# Patient Record
Sex: Female | Born: 1975 | Race: White | Hispanic: No | Marital: Married | Smoking: Never smoker
Health system: Southern US, Community
[De-identification: ages and names within clinical notes are randomized; demographics above are authoritative.]

## PROBLEM LIST (undated history)

## (undated) DIAGNOSIS — N920 Excessive and frequent menstruation with regular cycle: Secondary | ICD-10-CM

## (undated) DIAGNOSIS — T4145XA Adverse effect of unspecified anesthetic, initial encounter: Secondary | ICD-10-CM

## (undated) DIAGNOSIS — K219 Gastro-esophageal reflux disease without esophagitis: Secondary | ICD-10-CM

## (undated) DIAGNOSIS — T8859XA Other complications of anesthesia, initial encounter: Secondary | ICD-10-CM

---
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
## 2002-01-22 HISTORY — PX: DILATION AND CURETTAGE OF UTERUS: SHX78

## 2004-05-10 ENCOUNTER — Encounter (INDEPENDENT_AMBULATORY_CARE_PROVIDER_SITE_OTHER): Payer: Self-pay | Admitting: *Deleted

## 2004-05-10 ENCOUNTER — Inpatient Hospital Stay (HOSPITAL_COMMUNITY): Admission: RE | Admit: 2004-05-10 | Discharge: 2004-05-13 | Payer: Self-pay | Admitting: Obstetrics and Gynecology

## 2004-05-14 ENCOUNTER — Encounter: Admission: RE | Admit: 2004-05-14 | Discharge: 2004-06-13 | Payer: Self-pay | Admitting: Obstetrics and Gynecology

## 2004-07-14 ENCOUNTER — Encounter: Admission: RE | Admit: 2004-07-14 | Discharge: 2004-08-13 | Payer: Self-pay | Admitting: Obstetrics and Gynecology

## 2004-09-13 ENCOUNTER — Encounter: Admission: RE | Admit: 2004-09-13 | Discharge: 2004-10-13 | Payer: Self-pay | Admitting: Obstetrics and Gynecology

## 2004-10-14 ENCOUNTER — Encounter: Admission: RE | Admit: 2004-10-14 | Discharge: 2004-10-27 | Payer: Self-pay | Admitting: Obstetrics and Gynecology

## 2007-07-04 ENCOUNTER — Inpatient Hospital Stay (HOSPITAL_COMMUNITY): Admission: RE | Admit: 2007-07-04 | Discharge: 2007-07-06 | Payer: Self-pay | Admitting: Obstetrics and Gynecology

## 2007-07-08 ENCOUNTER — Encounter: Admission: RE | Admit: 2007-07-08 | Discharge: 2007-08-06 | Payer: Self-pay | Admitting: Obstetrics and Gynecology

## 2007-08-07 ENCOUNTER — Encounter: Admission: RE | Admit: 2007-08-07 | Discharge: 2007-09-06 | Payer: Self-pay | Admitting: Obstetrics and Gynecology

## 2007-09-07 ENCOUNTER — Encounter: Admission: RE | Admit: 2007-09-07 | Discharge: 2007-10-07 | Payer: Self-pay | Admitting: Obstetrics and Gynecology

## 2007-10-08 ENCOUNTER — Encounter: Admission: RE | Admit: 2007-10-08 | Discharge: 2007-11-06 | Payer: Self-pay | Admitting: Obstetrics and Gynecology

## 2007-11-07 ENCOUNTER — Encounter: Admission: RE | Admit: 2007-11-07 | Discharge: 2007-12-07 | Payer: Self-pay | Admitting: Obstetrics and Gynecology

## 2007-12-08 ENCOUNTER — Encounter: Admission: RE | Admit: 2007-12-08 | Discharge: 2008-01-06 | Payer: Self-pay | Admitting: Obstetrics and Gynecology

## 2010-06-06 NOTE — Op Note (Signed)
NAMETIYE, HUWE NO.:  [id number]   MEDICAL RECORD NO.:  [id number]          PATIENT TYPE:  INP   LOCATION:  9139                          FACILITY:  [facility]   PHYSICIAN:  Maxie Better, M.D.DATE OF BIRTH:  12-16-1975   DATE OF PROCEDURE:  07/04/2007  DATE OF DISCHARGE:                               OPERATIVE REPORT   PREOPERATIVE DIAGNOSES:  1. Previous cesarean section.  2. Term gestation.   POSTOPERATIVE DIAGNOSES:  1. Previous cesarean section.  2. Term gestation.   PROCEDURE:  Repeat cesarean section Kerr hysterotomy.   ANESTHESIA:  Spinal.   SURGEON:  Maxie Better, MD   ASSISTANT:  Marlinda Mike, CNM   PROCEDURE:  Under adequate spinal anesthesia, the patient was placed in  a supine position with a left lateral tilt.  She was sterilely prepped  and draped in the usual fashion and an indwelling Foley catheter was  sterilely placed.  Marcaine 0.25% was injected along the previous  Pfannenstiel skin incision site.  Skin incision was then made and  carried on to the rectus fascia.  Rectus fascia was opened transversely.  Rectus fascia was then bluntly and sharply dissected off the rectus  muscles in a superior and inferior fashion.  The rectus muscles already  slightly separated.  The parietal peritoneum was opened sharply and  extended.  There was bladder adhesions noted on the lower uterine  segment.  Careful dissection resulted in just a small amount of the  bladder flap to be displaced inferiorly.  Nonetheless, a curvilinear low  transverse uterine incision was able to be made and extended with  bandage scissors.  Artificial rupture of membranes clear amniotic fluid  noted.  Subsequent delivery of a live female was accomplished.  Baby was  bulb suctioned and the abdomen and cord was clamped and cut.  The baby  was transferred to the awaiting pediatrician with assigned Apgars of 9  and 9 at 1 and 5 minutes.  The placenta was manually  removed.  Uterine  cavity was cleaned of debris.  Uterus was exteriorized.  Uterine  incision was noted to have no extension.  Uterine incision was then  closed in two layers, the first layer with 0 Monocryl running lock  stitch.  Due to the thinness of the lower uterine segment, a second  imbricating stitch was not placed.  A reinforcing suture was placed in  the right angle due to a small amount of bleeding.  Normal tubes and  ovaries were noted bilaterally.  The uterus was then returned to the  abdomen.  The abdomen was copiously irrigated and suctioned of debris.  The parietal peritoneum was closed with 2-0 Vicryl.  The rectus fascia  was closed with 0 Vicryl x2.  There was a pumping blood vessel  inferiorly which had been clamped and suture ligated x2 with good  hemostasis noted.  The rectus fascia was closed with 0 Vicryl x2.  The  subcutaneous area was irrigated and small bleeders cauterized.  Interrupted 2-0 plain sutures were placed followed by Ethicon staples  for the skin.   SPECIMENS:  Placenta  not sent to pathology.   ESTIMATED BLOOD LOSS:  800 mL.   INTRAOPERATIVE FLUID:  1600 mL crystalloid.   URINE OUTPUT:  350 mL clear yellow urine.   COUNTS:  Sponge and instrument counts x2 was correct.   COMPLICATION:  None.   The weight of the baby was 7 pounds 7 ounces.   The patient tolerated the procedure well and was transferred to the  recovery room in stable condition.      Maxie Better, M.D.  Electronically Signed     [place]/MEDQ  D:  07/04/2007  T:  07/04/2007  Job:  [id number]

## 2010-06-06 NOTE — Discharge Summary (Signed)
Carly Gonzales, STENGEL NO.:  [id number]   MEDICAL RECORD NO.:  [id number]          PATIENT TYPE:  INP   LOCATION:  9139                          FACILITY:  [facility]   PHYSICIAN:  Maxie Better, M.D.DATE OF BIRTH:  1975/02/27   DATE OF ADMISSION:  07/04/2007  DATE OF DISCHARGE:  07/06/2007                               DISCHARGE SUMMARY   ADMISSION DIAGNOSIS:  Previous cesarean section, term gestation.   DISCHARGE DIAGNOSIS:  Previous cesarean section, term gestation,  delivered.   PROCEDURE:  Repeat cesarean section.   HISTORY OF PRESENT ILLNESS:  A 35 year old gravida 2, para 1-0-0-1  female with a previous cesarean section, requesting a repeat cesarean  section.   HOSPITAL COURSE:  The patient was admitted.  She underwent a repeat  cesarean section.  The patient had a live female, 7 pounds 7 ounces,  Apgars of 9 and 9.  She had an uncomplicated postoperative course.  Her  CBC showed a hemoglobin of 8.4, hematocrit 25.8, white count of 11.5,  platelet count of 149,000.  Her admission hemoglobin was 10.4.  The  patient requested early discharge.   Her disposition is home.  Condition stable.   DISCHARGE MEDICATIONS:  1. Motrin 600 mg every 6 hours p.r.n. pain.  2. Percocet 1-2 tablets every 3-4 hours p.r.n. pain.  3. Nu-Iron 150 mg 1 p.o. b.i.d.  4. Colace 100 mg p.o. b.i.d.   DISCHARGE INSTRUCTIONS:  Per the postpartum booklet given.  Followup  appointment at [facility] in 6 weeks.      Maxie Better, M.D.  Electronically Signed     [place]/MEDQ  D:  08/14/2007  T:  08/14/2007  Job:  [id number]

## 2010-06-09 NOTE — H&P (Signed)
Carly Gonzales, Carly Gonzales NO.:  [id number]   MEDICAL RECORD NO.:  [id number]          PATIENT TYPE:  INP   LOCATION:  NA                            FACILITY:  [facility]   PHYSICIAN:  Maxie Better, M.D.DATE OF BIRTH:  [birth year]   DATE OF ADMISSION:  05/10/2004  DATE OF DISCHARGE:                                HISTORY & PHYSICAL   CHIEF COMPLAINT:  Fetal macrosomia.   HISTORY OF PRESENT ILLNESS:  This is a 35 year old, gravida 2, para 0-0-1-0,  married white female, EDC of May 12, 2004 by first trimester ultrasound  whose now at 51 5/[redacted] weeks gestation being admitted for primary cesarean  section after ultrasound for size greater than dates revealed an estimated  fetal weight of 4124 g which is at the 92nd percentile. The abdominal  circumference was at the 99th percentile. Normal amniotic fluid index noted.  The patient's prenatal course has been notable for an abnormal one hour  glucose challenge test, a three hour glucose tolerance test that had one  abnormal value by ACOG and two abnormal values by the Carpenter-Coustan  criteria. The patient was not monitored  with her blood sugars; however, in  light of the larger abdominal circumference, the estimated fetal weight of  the baby in a patient who is 5 feet 1, 169.5 pounds with a cervix that is  long, closed, presenting parts out of pelvis. The patient was offered a  primary cesarean section. She was also given the option of awaiting labor  with the possibility of a primary cesarean section as a result of failure to  dilate and/or full dilatation with concerns for shoulder dystocia. After  revealing the risks of cesarean section, the patient is now presenting for  her procedure. She has had good fetal movement, no contractions. Prenatal  care is [facility], primary obstetrician Maxie Better, M.D.   PRENATAL LABS:  Blood type A positive, antibody screen is negative, RPR is  nonreactive, rubella is  immune, hepatitis B surface antigen is negative, HIV  test was declined. Genetic screening was declined. A one hour glucose  challenge test was 161, three hour glucose tolerance test was 69, at fasting  one hour 191, two hour 156, and three hour at 110. Group B strep culture was  negative. Previous ultrasound on April 13, 2004 had an appropriate growth  baby at 5 pounds 15 ounces in the 41st percentile.   PAST MEDICAL HISTORY:  Allergy is to PENICILLIN. Medical history negative.   MEDICATIONS:  Prenatal vitamins, Zantac, Tums.   PAST SURGICAL HISTORY:  D&C.   OB HISTORY:  MAB November 2004.   FAMILY HISTORY:  Maternal first cousin with osteogenic imperfecta, maternal  grandfather with Huntington's chorea.   SOCIAL HISTORY:  Married, nonsmoker, works in Air[employer].   REVIEW OF SYMPTOMS:  Negative.   PHYSICAL EXAMINATION:  GENERAL:  Well-developed, well-nourished, gravid  white female in no acute distress.  VITAL SIGNS:  Blood pressure 120/80, weight 169.25, fetal heart rate of 130.  SKIN:  No lesions.  HEENT:  Anicteric sclera, pink conjunctiva, oropharynx negative.  HEART:  Regular  rate and rhythm without murmur.  LUNGS:  Clear to auscultation.  BREASTS:  Soft, nontender, no palpable mass.  ABDOMEN:  Gravid, fundal height of 41.5 cm.  PELVIC:  Cervix is long, closed, presenting part not in pelvis. Vertex by  ultrasound.  EXTREMITIES:  No edema.   IMPRESSION:  Fetal macrosomia, possible gestational diabetes, term  gestation.   PLAN:  Admission for primary cesarean section, routine admission labs and  orders. The risks of cesarean section were discussed including but not  limited to infection, bleeding, injury to surrounding organ structures,  possible need cesarean section in the future. A lengthy discussion was also  done with respect to the margin of error of the ultrasound which is  plus/minus a pound. All questions answered.      /MEDQ  D:  05/10/2004  T:  05/10/2004   Job:  [id number]

## 2010-10-19 LAB — CBC
Hemoglobin: 8.4 — ABNORMAL LOW
MCV: 75.5 — ABNORMAL LOW
Platelets: 173
RBC: 3.41 — ABNORMAL LOW
RDW: 19.7 — ABNORMAL HIGH
RDW: 19.7 — ABNORMAL HIGH
WBC: 7.7

## 2010-10-19 LAB — CCBB MATERNAL DONOR DRAW

## 2010-10-19 LAB — RPR: RPR Ser Ql: NONREACTIVE

## 2013-01-28 ENCOUNTER — Ambulatory Visit
Admission: RE | Admit: 2013-01-28 | Discharge: 2013-01-28 | Disposition: A | Discharge status: Home / Self Care | Payer: 59 | Source: Ambulatory Visit | Attending: Family Medicine | Admitting: Family Medicine

## 2013-01-28 ENCOUNTER — Other Ambulatory Visit: Payer: Self-pay | Admitting: Family Medicine

## 2013-01-28 ENCOUNTER — Other Ambulatory Visit (HOSPITAL_COMMUNITY)
Admission: RE | Admit: 2013-01-28 | Discharge: 2013-01-28 | Disposition: A | Discharge status: Home / Self Care | Payer: 59 | Source: Ambulatory Visit | Attending: Family Medicine | Admitting: Family Medicine

## 2013-01-28 DIAGNOSIS — M545 Low back pain, unspecified: Secondary | ICD-10-CM

## 2013-01-28 DIAGNOSIS — Z0000 Encounter for general adult medical examination without abnormal findings: Secondary | ICD-10-CM | POA: Insufficient documentation

## 2014-11-25 ENCOUNTER — Other Ambulatory Visit: Payer: Self-pay | Admitting: Advanced Practice Midwife

## 2015-03-16 ENCOUNTER — Other Ambulatory Visit: Payer: Self-pay | Admitting: Family Medicine

## 2015-03-16 ENCOUNTER — Other Ambulatory Visit (HOSPITAL_COMMUNITY)
Admission: RE | Admit: 2015-03-16 | Discharge: 2015-03-16 | Disposition: A | Discharge status: Home / Self Care | Payer: BLUE CROSS/BLUE SHIELD | Source: Ambulatory Visit | Attending: Family Medicine | Admitting: Family Medicine

## 2015-03-16 DIAGNOSIS — Z01419 Encounter for gynecological examination (general) (routine) without abnormal findings: Secondary | ICD-10-CM | POA: Insufficient documentation

## 2015-03-21 LAB — CYTOLOGY - PAP

## 2015-09-01 IMAGING — CR DG LUMBAR SPINE COMPLETE 4+V
5 series · 5 of 5 positions shown · non-contrast
Comparison: None.

CLINICAL DATA: Right-sided low back pain and right leg pain.

EXAM:
LUMBAR SPINE - COMPLETE 4+ VIEW

[view not recorded (1 of 5)]
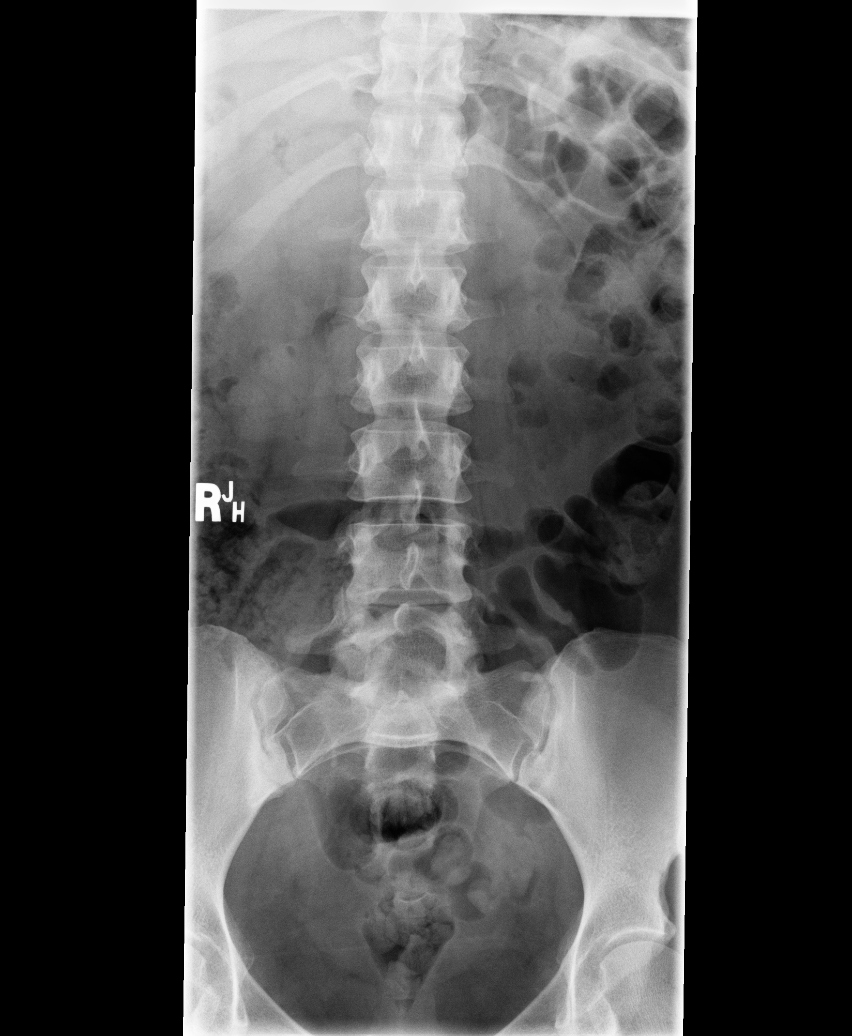

[view not recorded (2 of 5)]
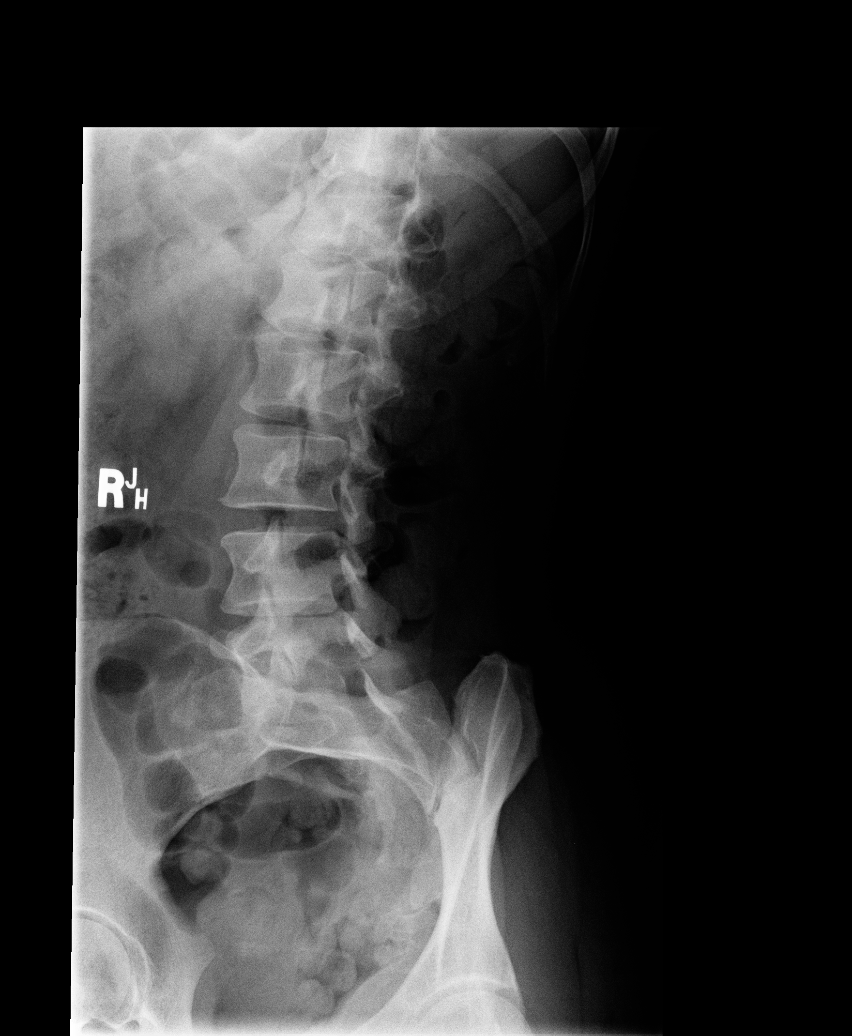

[view not recorded (3 of 5)]
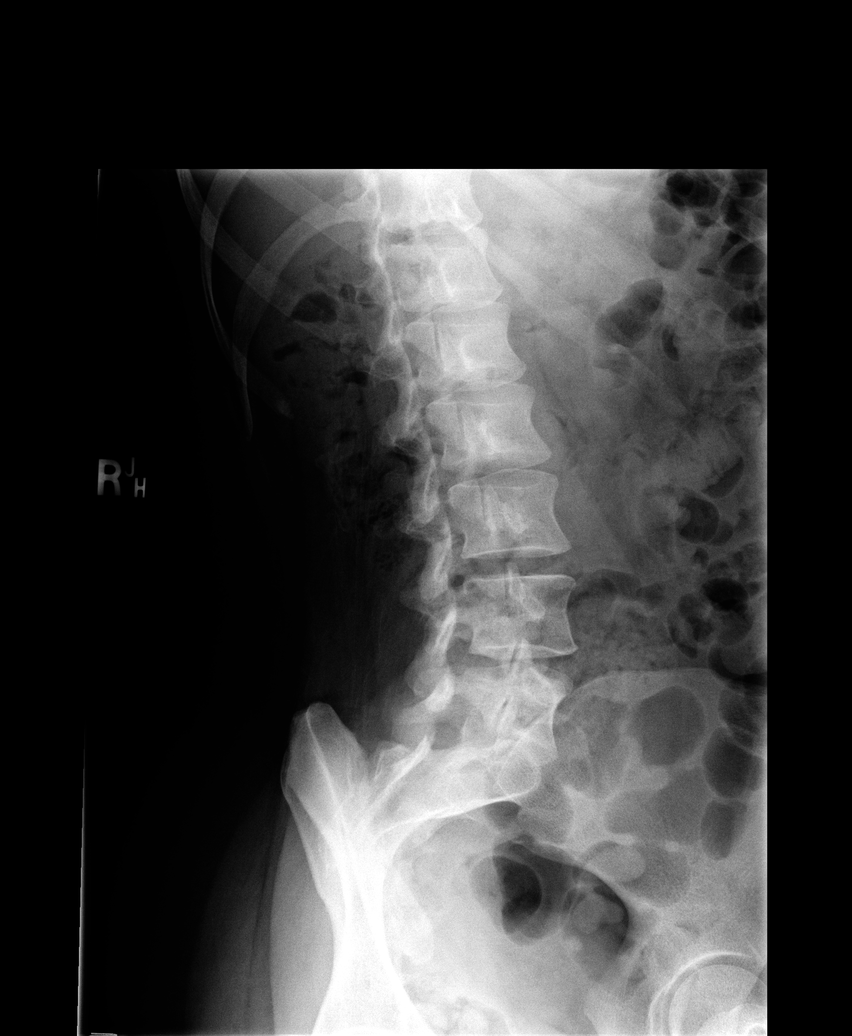

[view not recorded (4 of 5)]
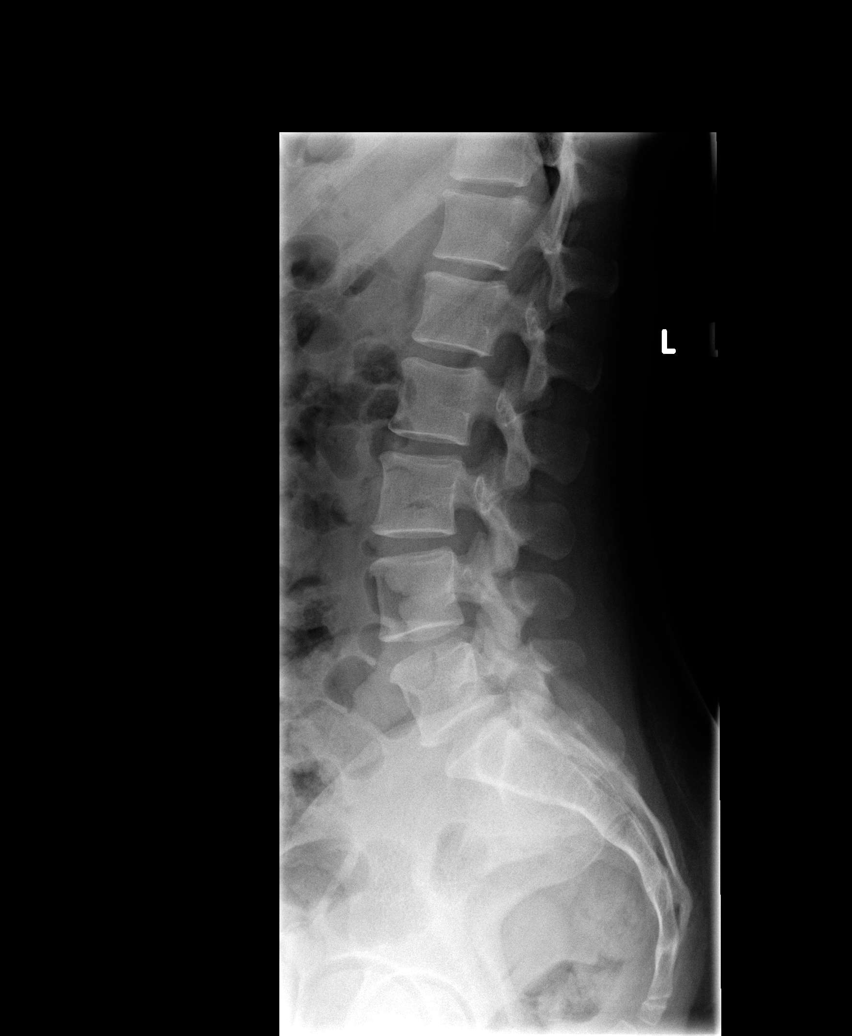

[view not recorded (5 of 5)]
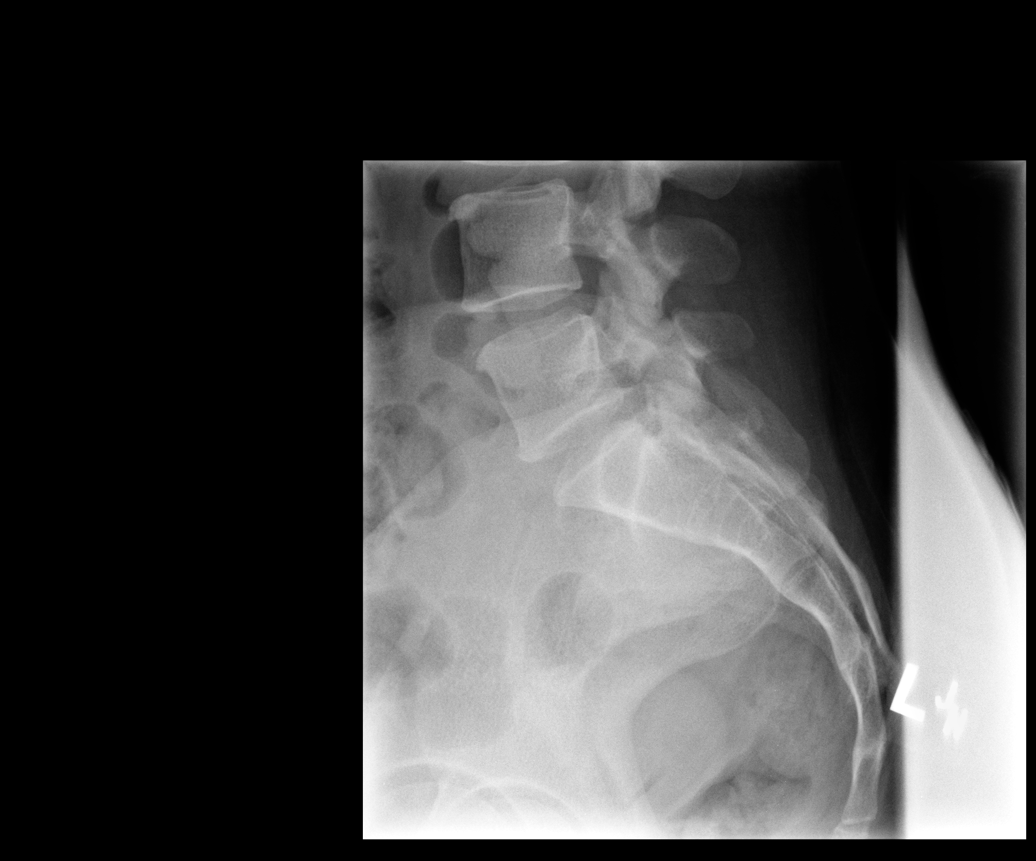

[5 of 5 positions shown; findings below may reference images not displayed]

FINDINGS: Alignment is anatomic. Vertebral body and disc space height are
maintained. Trace endplate degenerative changes from L3-4 to L5-S1.
No definite pars defects.
IMPRESSION: Minimal spondylosis.

## 2017-12-31 ENCOUNTER — Encounter: Payer: Self-pay | Admitting: Family Medicine

## 2018-01-27 ENCOUNTER — Encounter: Payer: 59 | Attending: Family Medicine | Admitting: Registered"

## 2018-01-27 ENCOUNTER — Encounter: Payer: Self-pay | Admitting: Registered"

## 2018-01-27 DIAGNOSIS — E785 Hyperlipidemia, unspecified: Secondary | ICD-10-CM | POA: Diagnosis present

## 2018-01-27 NOTE — Patient Instructions (Addendum)
Exercise 3x week, keep clothes in car for impromptu exercise opportunities. Mindful meditiation: https://palousemindfulness.com/ or Rodell Perna, 21 day meditation Aim to get more fruits and vegetables, increase by 1-2 servings per day Pay attention food with creamy sauce, cheese, red meat (choosing poultry more often)

## 2018-01-27 NOTE — Progress Notes (Signed)
Medical Nutrition Therapy:  Appt start time: 1615 end time:  1715.   Assessment:  Primary concerns today: Patient states she wants to accomplish some long-term goals, lowering her LDL cholesterol before her next annual wellness visit, and some weight loss. Pt states she has tried weight watchers before and liked the flexibility of the plan, is currently trying intermittent fasting with an eating window of about 12-8 pm. Pt states she used to have coffee with cream in the morning and a small breakfast. Pt states before making changes she would have an afternoon lull in energy, but is not experiencing that now. Pt states she is feeling less sluggish.  Patient states she thinks she may be eating too large of portion sizes, sometimes eats as much as her husband. Pt states she consciously puts on her plate the amount she feels will satisfy her instead of the amount she could eat which would be more. Pt states she stops eating when her plate is empty, except sometimes when eating out will take left overs home. Pt states if she wants to eat more she will drink water.  Eating style:  Lunch: eats in conference room 30 min, usually has company. Sometimes orders out with coworkers - yesterday [employer]. Dinner: Eats at the table with family, aims to eat balanced meals.  Pt states over the holidays they had more sweets in their home while her mother was staying with them. Pt states she joined [employer] 2 months ago and was going  3x/week with 63 yr old son, T,Th,Sat. Circuit training 30 min + cardio until the holidays hit and has not gone. 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 Recently 63 mo old puppy disturbs sleep. Stress: 6/10, tries not to get too stress. Pt states her body might be experiencing more stress that her mind thinks it  is.  Preferred Learning Style:   No preference indicated   Learning Readiness:   Change in progress   MEDICATIONS: none   DIETARY INTAKE:  Usual eating pattern includes 2 meals and 0-1 snacks per day. Intermittent Fasting for the last 4 days; eating window 1-9 pm  24-hr recall:  B ( AM):water, black coffee Snk ( AM): none  L (1 PM): left overs OR sandwich or salad Snk ( PM): none D (6 PM): spaghetti OR tacos OR chicken OR sushi OR burger Snk ( PM): none OR sm piece of cake OR fruit Beverages: water, coffee, diet coke 2/month, wine with dinner sometimes  Usual physical activity: ADL, sedentary work  Estimated energy needs: 1800 calories  Progress Towards Goal(s):  New goals.   Nutritional Diagnosis:  NI-5.8.5 Inadeqate fiber intake As related to less than recommended fruit and vegetable intake.  As evidenced by dietary recall.    Intervention:  Nutrition Education. Discussed MyPlate. Discussed importance of hunger and fullness cues for deciding amount to eat and using vegetables instead of water to help with fullness cues. Discussed importance of mindfulness for stress management.  Plan: Exercise 3x week, keep clothes in car for impromptu exercise opportunities. Mindful meditiation: https://palousemindfulness.com/ or [employer], 21 day meditation Aim to get more fruits and vegetables, increase by 1-2 servings per day Pay attention food with creamy sauce, cheese, red meat (choosing poultry more often)   Teaching Method Utilized:  Visual Auditory Hands  on  Handouts given during visit include:  none  Barriers to learning/adherence to lifestyle change: none  Demonstrated degree of understanding via:  Teach Back   Monitoring/Evaluation:  Dietary intake, exercise, and body weight in 4 week(s).

## 2018-02-26 DIAGNOSIS — Z298 Encounter for other specified prophylactic measures: Secondary | ICD-10-CM | POA: Diagnosis not present

## 2018-03-03 ENCOUNTER — Ambulatory Visit: Payer: 59 | Admitting: Registered"

## 2018-05-07 DIAGNOSIS — M7711 Lateral epicondylitis, right elbow: Secondary | ICD-10-CM | POA: Diagnosis not present

## 2018-05-22 DIAGNOSIS — N390 Urinary tract infection, site not specified: Secondary | ICD-10-CM | POA: Diagnosis not present

## 2019-02-10 ENCOUNTER — Other Ambulatory Visit: Payer: Self-pay

## 2019-02-10 ENCOUNTER — Other Ambulatory Visit (HOSPITAL_COMMUNITY)
Admission: RE | Admit: 2019-02-10 | Discharge: 2019-02-10 | Disposition: A | Discharge status: Home / Self Care | Payer: 59 | Source: Ambulatory Visit | Attending: Obstetrics and Gynecology | Admitting: Obstetrics and Gynecology

## 2019-02-10 ENCOUNTER — Other Ambulatory Visit: Payer: Self-pay | Admitting: Obstetrics and Gynecology

## 2019-02-10 ENCOUNTER — Encounter (HOSPITAL_BASED_OUTPATIENT_CLINIC_OR_DEPARTMENT_OTHER): Payer: Self-pay | Admitting: Obstetrics and Gynecology

## 2019-02-10 DIAGNOSIS — Z01812 Encounter for preprocedural laboratory examination: Secondary | ICD-10-CM | POA: Diagnosis present

## 2019-02-10 DIAGNOSIS — Z20822 Contact with and (suspected) exposure to covid-19: Secondary | ICD-10-CM | POA: Diagnosis not present

## 2019-02-10 LAB — SARS CORONAVIRUS 2 (TAT 6-24 HRS): SARS Coronavirus 2: NEGATIVE

## 2019-02-10 NOTE — Progress Notes (Signed)
Spoke w/ via phone for pre-op interview--- PT Lab needs dos---- CBC, Urine preg             Lab results------ no COVID test ------ 02-10-2019 @ 1230 Arrive at ------- 0915 NPO after ------ MN Medications to take morning of surgery ----- Zegerid w/ sips of water Diabetic medication ----- n/a Patient Special Instructions ----- n/a Pre-Op special Istructions ----- pt case added on today,  Pre-op orders pending for second sign Patient verbalized understanding of instructions that were given at this phone interview. Patient denies shortness of breath, chest pain, fever, cough a this phone interview.

## 2019-02-12 ENCOUNTER — Ambulatory Visit (HOSPITAL_BASED_OUTPATIENT_CLINIC_OR_DEPARTMENT_OTHER): Payer: 59 | Admitting: Anesthesiology

## 2019-02-12 ENCOUNTER — Encounter (HOSPITAL_BASED_OUTPATIENT_CLINIC_OR_DEPARTMENT_OTHER): Payer: Self-pay | Admitting: Obstetrics and Gynecology

## 2019-02-12 ENCOUNTER — Other Ambulatory Visit: Payer: Self-pay

## 2019-02-12 ENCOUNTER — Encounter (HOSPITAL_BASED_OUTPATIENT_CLINIC_OR_DEPARTMENT_OTHER)
Admission: RE | Disposition: A | Discharge status: Home / Self Care | Payer: Self-pay | Source: Ambulatory Visit | Attending: Obstetrics and Gynecology

## 2019-02-12 ENCOUNTER — Ambulatory Visit (HOSPITAL_BASED_OUTPATIENT_CLINIC_OR_DEPARTMENT_OTHER)
Admission: RE | Admit: 2019-02-12 | Discharge: 2019-02-12 | Disposition: A | Discharge status: Home / Self Care | Payer: 59 | Source: Ambulatory Visit | Attending: Obstetrics and Gynecology | Admitting: Obstetrics and Gynecology

## 2019-02-12 DIAGNOSIS — Z539 Procedure and treatment not carried out, unspecified reason: Secondary | ICD-10-CM | POA: Insufficient documentation

## 2019-02-12 DIAGNOSIS — N920 Excessive and frequent menstruation with regular cycle: Secondary | ICD-10-CM | POA: Diagnosis present

## 2019-02-12 DIAGNOSIS — K219 Gastro-esophageal reflux disease without esophagitis: Secondary | ICD-10-CM | POA: Insufficient documentation

## 2019-02-12 DIAGNOSIS — Z79899 Other long term (current) drug therapy: Secondary | ICD-10-CM | POA: Diagnosis not present

## 2019-02-12 HISTORY — DX: Excessive and frequent menstruation with regular cycle: N92.0

## 2019-02-12 HISTORY — DX: Other complications of anesthesia, initial encounter: T88.59XA

## 2019-02-12 HISTORY — DX: Gastro-esophageal reflux disease without esophagitis: K21.9

## 2019-02-12 LAB — POCT PREGNANCY, URINE: Preg Test, Ur: NEGATIVE

## 2019-02-12 LAB — CBC
HCT: 42.7 % (ref 36.0–46.0)
Hemoglobin: 13.8 g/dL (ref 12.0–15.0)
MCH: 30 pg (ref 26.0–34.0)
MCHC: 32.3 g/dL (ref 30.0–36.0)
MCV: 92.8 fL (ref 80.0–100.0)
Platelets: 260 10*3/uL (ref 150–400)
RBC: 4.6 MIL/uL (ref 3.87–5.11)
RDW: 13.9 % (ref 11.5–15.5)
WBC: 7 10*3/uL (ref 4.0–10.5)
nRBC: 0 % (ref 0.0–0.2)

## 2019-02-12 SURGERY — DILATATION & CURETTAGE/HYSTEROSCOPY WITH NOVASURE ABLATION
Anesthesia: General

## 2019-02-12 MED ORDER — SODIUM CHLORIDE 0.9 % IR SOLN
Status: DC | PRN
  Administered 2019-02-12: 3000 mL

## 2019-02-12 MED ORDER — MIDAZOLAM HCL 2 MG/2ML IJ SOLN
INTRAMUSCULAR | Status: AC
  Filled 2019-02-12: qty 2

## 2019-02-12 MED ORDER — PHENYLEPHRINE 40 MCG/ML (10ML) SYRINGE FOR IV PUSH (FOR BLOOD PRESSURE SUPPORT)
PREFILLED_SYRINGE | INTRAVENOUS | Status: AC
  Filled 2019-02-12: qty 10

## 2019-02-12 MED ORDER — PHENYLEPHRINE HCL (PRESSORS) 10 MG/ML IV SOLN
INTRAVENOUS | Status: AC
  Filled 2019-02-12: qty 4

## 2019-02-12 MED ORDER — WHITE PETROLATUM EX OINT
TOPICAL_OINTMENT | CUTANEOUS | Status: AC
  Filled 2019-02-12: qty 5

## 2019-02-12 MED ORDER — LACTATED RINGERS IV SOLN
INTRAVENOUS | Status: DC
  Filled 2019-02-12: qty 1000

## 2019-02-12 MED ORDER — FENTANYL CITRATE (PF) 100 MCG/2ML IJ SOLN
INTRAMUSCULAR | Status: AC
  Filled 2019-02-12: qty 2

## 2019-02-12 MED ORDER — EPHEDRINE 5 MG/ML INJ
INTRAVENOUS | Status: AC
  Filled 2019-02-12: qty 10

## 2019-02-12 MED ORDER — GLYCOPYRROLATE PF 0.2 MG/ML IJ SOSY
PREFILLED_SYRINGE | INTRAMUSCULAR | Status: AC
  Filled 2019-02-12: qty 1

## 2019-02-12 MED ORDER — PROPOFOL 10 MG/ML IV BOLUS
INTRAVENOUS | Status: AC
  Filled 2019-02-12: qty 20

## 2019-02-12 SURGICAL SUPPLY — 21 items
ABLATOR ENDOMETRIAL MYOSURE (ABLATOR) IMPLANT
BIPOLAR CUTTING LOOP 21FR (ELECTRODE)
CANISTER SUCT 3000ML PPV (MISCELLANEOUS) ×2 IMPLANT
CATH ROBINSON RED A/P 16FR (CATHETERS) IMPLANT
COVER WAND RF STERILE (DRAPES) ×2 IMPLANT
DILATOR CANAL MILEX (MISCELLANEOUS) IMPLANT
GAUZE 4X4 16PLY RFD (DISPOSABLE) ×2 IMPLANT
GAUZE VASELINE 1X8 (GAUZE/BANDAGES/DRESSINGS) IMPLANT
GLOVE BIOGEL PI IND STRL 7.0 (GLOVE) ×1 IMPLANT
GLOVE BIOGEL PI INDICATOR 7.0 (GLOVE) ×1
GLOVE ECLIPSE 6.5 STRL STRAW (GLOVE) ×2 IMPLANT
GOWN STRL REUS W/TWL LRG LVL3 (GOWN DISPOSABLE) ×2 IMPLANT
IV NS IRRIG 3000ML ARTHROMATIC (IV SOLUTION) ×2 IMPLANT
KIT PROCEDURE FLUENT (KITS) ×2 IMPLANT
KIT TURNOVER CYSTO (KITS) ×2 IMPLANT
LOOP CUTTING BIPOLAR 21FR (ELECTRODE) IMPLANT
PACK VAGINAL MINOR WOMEN LF (CUSTOM PROCEDURE TRAY) ×2 IMPLANT
PAD OB MATERNITY 4.3X12.25 (PERSONAL CARE ITEMS) ×2 IMPLANT
PAD PREP 24X48 CUFFED NSTRL (MISCELLANEOUS) ×2 IMPLANT
TOWEL OR 17X26 10 PK STRL BLUE (TOWEL DISPOSABLE) IMPLANT
WATER STERILE IRR 500ML POUR (IV SOLUTION) ×2 IMPLANT

## 2019-02-12 NOTE — Anesthesia Preprocedure Evaluation (Signed)
Anesthesia Evaluation  Patient identified by MRN, date of birth, ID band Patient awake    Reviewed: Allergy & Precautions, NPO status , Patient's Chart, lab work & pertinent test results  Airway Mallampati: II  TM Distance: >3 FB Neck ROM: Full    Dental no notable dental hx.    Pulmonary neg pulmonary ROS,    Pulmonary exam normal breath sounds clear to auscultation       Cardiovascular negative cardio ROS Normal cardiovascular exam Rhythm:Regular Rate:Normal     Neuro/Psych negative neurological ROS  negative psych ROS   GI/Hepatic Neg liver ROS, GERD  Medicated,  Endo/Other  negative endocrine ROS  Renal/GU negative Renal ROS  negative genitourinary   Musculoskeletal negative musculoskeletal ROS (+)   Abdominal   Peds negative pediatric ROS (+)  Hematology negative hematology ROS (+)   Anesthesia Other Findings   Reproductive/Obstetrics negative OB ROS                             Anesthesia Physical Anesthesia Plan  ASA: II  Anesthesia Plan: General   Post-op Pain Management:    Induction: Intravenous  PONV Risk Score and Plan: 3 and Ondansetron, Dexamethasone, Midazolam and Treatment may vary due to age or medical condition  Airway Management Planned: LMA  Additional Equipment:   Intra-op Plan:   Post-operative Plan: Extubation in OR  Informed Consent: I have reviewed the patients History and Physical, chart, labs and discussed the procedure including the risks, benefits and alternatives for the proposed anesthesia with the patient or authorized representative who has indicated his/her understanding and acceptance.     Dental advisory given  Plan Discussed with: CRNA and Surgeon  Anesthesia Plan Comments:         Anesthesia Quick Evaluation

## 2019-02-12 NOTE — Progress Notes (Signed)
Surgery cancelled, IV discontinued, pt dressed and discharged home with husband.

## 2019-02-12 NOTE — H&P (Signed)
Carly Gonzales is an 44 y.o. female [phone] MF with heavy menses presents for endometrial ablation.   Pertinent Gynecological History: Menses: heavy Bleeding: menorrhagia Contraception: none DES exposure: denies Blood transfusions: none Sexually transmitted diseases: no past history Previous GYN Procedures: DNC  Last mammogram: normal Date: 2021 Last pap: normal Date:2021 OB History: G3P2   Menstrual History: Menarche age: n/a Patient's last menstrual period was 01/28/2019.    Past Medical History:  Diagnosis Date  . Complication of anesthesia    02-10-2019  per pt had issue with exteneded period of numbness and pain w/ spinal anesthesia after C/S 07-04-2007 @[facility] ,  per pt resolved  . GERD (gastroesophageal reflux disease)   . Menorrhagia     Past Surgical History:  Procedure Laterality Date  . CESAREAN SECTION  07-04-2007;  05-10-2004  @[facility]   . DILATION AND CURETTAGE OF UTERUS  2004   missed ab    History reviewed. No pertinent family history.  Social History:  reports that she has never smoked. She has never used smokeless tobacco. She reports current alcohol use. She reports that she does not use drugs.  Allergies:  Allergies  Allergen Reactions  . Penicillins Anaphylaxis and Swelling  . Codeine Itching    Medications Prior to Admission  Medication Sig Dispense Refill Last Dose  . Norethin Ace-Eth Estrad-FE (BLISOVI 24 FE PO) Take 1 tablet by mouth at bedtime.   Past Week at Unknown time  . omeprazole-sodium bicarbonate (ZEGERID) 40-1100 MG capsule Take 1 capsule by mouth as needed.   02/11/2019 at Unknown time    Review of Systems  All other systems reviewed and are negative.   Blood pressure (!) 146/85, pulse 87, temperature 98 F (36.7 C), temperature source Oral, resp. rate 18, height 5\' 1"  (1.549 m), weight 65.8 kg, last menstrual period 01/28/2019, SpO2 99 %. Physical Exam  Constitutional: She is oriented to person, place, and time. She appears  well-developed and well-nourished.  HENT:  Head: Atraumatic.  Eyes: EOM are normal.  Cardiovascular: Regular rhythm.  Respiratory: Effort normal.  GI: Soft.  Genitourinary:    Vagina and uterus normal.   Musculoskeletal:     Cervical back: Neck supple.  Neurological: She is alert and oriented to person, place, and time.  Skin: Skin is warm and dry.  Psychiatric: She has a normal mood and affect.    Results for orders placed or performed during the hospital encounter of 02/12/19 (from the past 24 hour(s))  CBC     Status: None   Collection Time: 02/12/19  9:52 AM  Result Value Ref Range   WBC 7.0 4.0 - 10.5 K/uL   RBC 4.60 3.87 - 5.11 MIL/uL   Hemoglobin 13.8 12.0 - 15.0 g/dL   HCT 03/28/2019 02/14/19 - 02/14/19 %   MCV 92.8 80.0 - 100.0 fL   MCH 30.0 26.0 - 34.0 pg   MCHC 32.3 30.0 - 36.0 g/dL   RDW 37.6 28.3 - 15.1 %   Platelets 260 150 - 400 K/uL   nRBC 0.0 0.0 - 0.2 %  Pregnancy, urine POC     Status: None   Collection Time: 02/12/19  9:55 AM  Result Value Ref Range   Preg Test, Ur NEGATIVE NEGATIVE    No results found.  Assessment/Plan: Menorrhagia P) dx hysteroscopy, endometrial ablation. Risk of surgery includes infection, bleeding, poss uterine perforation and its risk , thermal injury, fluid overload, 90 % reduction in flow  Carly Gonzales A Lyndia Bury 02/12/2019, 10:48 AM

## 2019-02-12 NOTE — Progress Notes (Signed)
Surgery cxl per pt request due to estimated cost from the hospital standpoint. She had quotes from her insurance regarding in office procedure. Pt will be contacted by office to r/s surgery. Advised that sometimes the procedure cannot be completed in the office and one cannot predict that ahead of time. Pt express understanding

## 2023-04-27 NOTE — Progress Notes (Signed)
 Assessment:   1. Dysuria   2. Bacterial UTI       Plan:   1. Medications and orders:  Patient's Medications  New Prescriptions   CIPROFLOXACIN (CIPRO) 500 MG TABLET    Take one tablet (500 mg dose) by mouth 2 (two) times daily for 7 days.   PHENAZOPYRIDINE (PYRIDIUM) 200 MG TABLET    Take one tablet (200 mg dose) by mouth 3 (three) times a day as needed for Pain for up to 2 days.     Current Outpatient Medications  Medication Instructions  . ciprofloxacin (CIPRO) 500 mg, Oral, 2 times a day  . phenazopyridine (PYRIDIUM) 200 mg, Oral, 3 times a day as needed  . topiramate (TOPAMAX) 50 mg, Daily       MEDICAL DECISION MAKING  Carly Gonzales, a 48 y.o. female, presenting with urinary symptoms including frequency, dysuria and urgency onset 3-4 days. She is afebrile with stable vital signs. Urinalysis is concerning for infection. A urine culture has been sent to assess for specific bacteria  growth and antibiotic sensitivity.  No fever or CVA tenderness to suggest Pyelonephritis. She was prescribed ciprofloxacim for treatment. Advised to push fluids to help flush out infection. Referred to PCP for follow-up. Advised of strict ED precautions including fever, inability to keep medication down due to vomiting or inability to urinate.    Encounter Diagnoses  Name Primary?  . Dysuria Yes  . Bacterial UTI      The diagnosis was discussed in detail with Carly Gonzales. We discussed other possible causes of their symptoms, treatment options, risks, and benefits. Carly Gonzales is in agreement with the management plan and all questions were answered.    Lab orders placed during the encounter:  Recent Results (from the past 24 hours)  POC Urine Dipstick   Collection Time: 04/27/23 11:43 AM  Result Value Ref Range   Color Yellow Yellow   Clarity Slightly cloudy (A) Clear   Glucose,Urine Negative Negative mg/dL   Bilirubin Negative Negative   Ketones,Urine Negative Negative mg/dL   Specific  Gravity 8.989 1.005, 1.010, 1.015, 1.020, 1.025, 1.030   Blood 2+ (A) Negative   pH 6.5    Protein Negative Negative mg/dL   Urobilinogen 0.2 0.2, 1.0 EU/dL   Nitrite Negative Negative   Leukocyte Esterase +/- (A) Negative      Patient's Medications  New Prescriptions   CIPROFLOXACIN (CIPRO) 500 MG TABLET    Take one tablet (500 mg dose) by mouth 2 (two) times daily for 7 days.   PHENAZOPYRIDINE (PYRIDIUM) 200 MG TABLET    Take one tablet (200 mg dose) by mouth 3 (three) times a day as needed for Pain for up to 2 days.         Carly Gonzales will continue all current prescription medication for chronic conditions. We also discussed OTC medications to be used to include OTC Tylenol PRN for pain and/or fever, OTC NSAIDS PRN for pain and /or fever. If symptoms do not improve or worsen in the next 2 days, Carly Gonzales will follow up with PCP, our clinic, or go to the ED if worse.       Carly Mace, NP   Subjective:    Carly Gonzales is a 48 y.o. female  who complains of dysuria, urinary frequency, and urinary urgency for 3  days The pain is mild and burns.  Patient denies back pain, sorethroat, stomach ache and vaginal discharge.  Patient does not have a history of recurrent UTI.  Patient does not have a history of pyelonephritis. Pt states took macrobid a few weeks ago and had a few days with no symptoms but over past 3-4 days symptoms worsened. States have taken macrobid in past and had to be changed to cipro with good relief.  Past Medical History:  Diagnosis Date  . Melanoma of hip, left (*)      Allergies  Allergen Reactions  . Codeine Itching  . Penicillins Anaphylaxis and Swelling    Throat swelling      Social History   Socioeconomic History  . Marital status: Unknown  Tobacco Use  . Smoking status: Never  . Smokeless tobacco: Never  Vaping Use  . Vaping status: Never Used  Substance and Sexual Activity  . Alcohol use: Yes    Comment: once monthly  . Drug use:  Never  . Sexual activity: Yes    Partners: Male      Objective:   BP 138/86 (BP Location: Right Upper Arm, Patient Position: Sitting)   Pulse 85   Temp 98 F (36.7 C) (Oral)   Resp 16   Ht 5' 1 (1.549 m)   Wt 140 lb (63.5 kg)   SpO2 96%   BMI 26.45 kg/m  General: alert, appears stated age, cooperative and no distress ENT: moist mucous membranes Lungs: CTAB, no wheeing, rales or rhonchi Cardio: regular, rate, rhythm with no rubs, murmurs or gallops Abdomen: Positive findings: tenderness mild suprapubic. Back: CVA tenderness absent GU: defer exam  Recent Results (from the past 14 hours)  POC Urine Dipstick   Collection Time: 04/27/23 11:43 AM  Result Value Ref Range   Color Yellow Yellow   Clarity Slightly cloudy (A) Clear   Glucose,Urine Negative Negative mg/dL   Bilirubin Negative Negative   Ketones,Urine Negative Negative mg/dL   Specific Gravity 8.989 1.005, 1.010, 1.015, 1.020, 1.025, 1.030   Blood 2+ (A) Negative   pH 6.5    Protein Negative Negative mg/dL   Urobilinogen 0.2 0.2, 1.0 EU/dL   Nitrite Negative Negative   Leukocyte Esterase +/- (A) Negative
